# Patient Record
Sex: Male | Born: 1995 | Hispanic: Yes | Marital: Single | Smoking: Never smoker
Health system: Southern US, Community
[De-identification: ages and names within clinical notes are randomized; demographics above are authoritative.]

---
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
## 2018-09-24 ENCOUNTER — Telehealth: Payer: Self-pay | Admitting: *Deleted

## 2018-09-24 DIAGNOSIS — Z20822 Contact with and (suspected) exposure to covid-19: Secondary | ICD-10-CM

## 2018-09-24 NOTE — Telephone Encounter (Signed)
Pt called again and appt scheduled fro 09/24/18 at 9:30 am. Pt notified testing would be at [facility] at [facility] [address]. Pt advised to stay in car when he arrives to testing site and to wear a mask. Pt notified that he would be contacted within 48-72 hours with results. Pt verbalized understanding.

## 2018-09-24 NOTE — Telephone Encounter (Signed)
Received call from Dr. [name] from the [facility]  to request COVID-19 testing for the pt. Pt can be contacted at [phone]. Per Dr. [name] pt does speak English.Attempted to call pt but no answer at this time. Left message for pt to return call to [phone] to be scheduled for appointment.

## 2018-09-25 ENCOUNTER — Other Ambulatory Visit: Payer: Self-pay

## 2018-09-25 DIAGNOSIS — Z20822 Contact with and (suspected) exposure to covid-19: Secondary | ICD-10-CM

## 2018-09-25 LAB — NOVEL CORONAVIRUS, NAA: SARS-CoV-2, NAA: DETECTED

## 2018-09-28 ENCOUNTER — Telehealth (HOSPITAL_COMMUNITY): Payer: Self-pay | Admitting: Internal Medicine

## 2018-09-28 LAB — NOVEL CORONAVIRUS, NAA: SARS-CoV-2, NAA: DETECTED — AB

## 2018-09-28 NOTE — Telephone Encounter (Signed)
Notified by [facility] of positive Covid-19 test result from testing 5/14.  Spoke with patient by phone to notify him of result.  Onset of symptoms was 5/12, with headache, light headedness. Fever on 5/13, just the one evening.  Feels better now, some residual cough with phlegm.  Advised to stay home for at least 10 days from symptom onset, with no fever x 3 days in a row and all respiratory symptoms improving.  The health department will also be in touch.  LM

## 2018-09-29 ENCOUNTER — Encounter: Payer: Self-pay | Admitting: Family Medicine

## 2018-09-29 DIAGNOSIS — U071 COVID-19: Secondary | ICD-10-CM | POA: Insufficient documentation

## 2018-09-29 NOTE — Telephone Encounter (Signed)
Dr. Hillery Aldo called to ask if the COVID test results were back on this patient, I advised of the note below from Dr. Dayton Scrape, she verbalized understanding and says she thought she would receive the results since she requested the test. I advised of the workflow to enter Dr. Dayton Scrape as the ordering physician and that's who the results will ultimately go to, she understood.

## 2019-01-21 ENCOUNTER — Emergency Department
Admission: EM | Admit: 2019-01-21 | Discharge: 2019-01-22 | Disposition: A | Discharge status: Home / Self Care | Payer: Self-pay | Attending: Emergency Medicine | Admitting: Emergency Medicine

## 2019-01-21 ENCOUNTER — Other Ambulatory Visit: Payer: Self-pay

## 2019-01-21 ENCOUNTER — Encounter: Payer: Self-pay | Admitting: Emergency Medicine

## 2019-01-21 DIAGNOSIS — L509 Urticaria, unspecified: Secondary | ICD-10-CM | POA: Insufficient documentation

## 2019-01-21 MED ORDER — DIPHENHYDRAMINE HCL 25 MG PO TABS: 25.0000 mg | ORAL_TABLET | Freq: Four times a day (QID) | ORAL | 0 refills | Status: DC | PRN

## 2019-01-21 MED ORDER — DIPHENHYDRAMINE HCL 25 MG PO CAPS
25.0000 mg | ORAL_CAPSULE | Freq: Once | ORAL | Status: AC
  Administered 2019-01-22: 25 mg via ORAL
  Filled 2019-01-21: qty 1

## 2019-01-21 MED ORDER — FAMOTIDINE 20 MG PO TABS
40.0000 mg | ORAL_TABLET | Freq: Once | ORAL | Status: AC
  Administered 2019-01-22: 40 mg via ORAL
  Filled 2019-01-21: qty 2

## 2019-01-21 MED ORDER — PREDNISONE 20 MG PO TABS
60.0000 mg | ORAL_TABLET | Freq: Once | ORAL | Status: AC
  Administered 2019-01-22: 01:00:00 60 mg via ORAL
  Filled 2019-01-21: qty 3

## 2019-01-21 MED ORDER — FAMOTIDINE 20 MG PO TABS: 20.0000 mg | ORAL_TABLET | Freq: Two times a day (BID) | ORAL | 0 refills | Status: AC

## 2019-01-21 NOTE — ED Provider Notes (Signed)
[facility] Emergency Department Provider Note  ____________________________________________  Time seen: Approximately 11:54 PM  I have reviewed the triage vital signs and the nursing notes.   HISTORY  Chief Complaint Rash    HPI Jared Harris is a 23 y.o. male presents to the emergency department with hives of the right upper extremity after patient ate dinner.  Hives have improved while waiting in the emergency department.  Patient denies shortness of breath, chest tightness or dysphasia.  He denies history of anaphylaxis.  No diarrhea or emesis.  No medications were attempted prior to coming into the emergency department.         History reviewed. No pertinent past medical history.  Patient Active Problem List   Diagnosis Date Noted  . COVID-19 virus infection 09/29/2018    History reviewed. No pertinent surgical history.  Prior to Admission medications   Medication Sig Start Date End Date Taking? Authorizing Provider  diphenhydrAMINE (BENADRYL ALLERGY) 25 MG tablet Take 1 tablet (25 mg total) by mouth every 6 (six) hours as needed for up to 5 days. 01/21/19 01/26/19  Lannie Fields, PA-C  famotidine (PEPCID) 20 MG tablet Take 1 tablet (20 mg total) by mouth 2 (two) times daily for 5 days. 01/21/19 01/26/19  Lannie Fields, PA-C    Allergies Patient has no known allergies.  No family history on file.  Social History Social History   Tobacco Use  . Smoking status: Never Smoker  . Smokeless tobacco: Never Used  Substance Use Topics  . Alcohol use: Not Currently  . Drug use: Not Currently     Review of Systems  Constitutional: No fever/chills Eyes: No visual changes. No discharge ENT: No upper respiratory complaints. Cardiovascular: no chest pain. Respiratory: no cough. No SOB. Gastrointestinal: No abdominal pain.  No nausea, no vomiting.  No diarrhea.  No constipation. Musculoskeletal: Negative for musculoskeletal pain. Skin:  Patient has urticaria.  Neurological: Negative for headaches, focal weakness or numbness.   ____________________________________________   PHYSICAL EXAM:  VITAL SIGNS: ED Triage Vitals  Enc Vitals Group     BP 01/21/19 2216 133/79     Pulse Rate 01/21/19 2216 84     Resp 01/21/19 2216 16     Temp 01/21/19 2216 98 F (36.7 C)     Temp Source 01/21/19 2216 Oral     SpO2 01/21/19 2216 98 %     Weight 01/21/19 2214 188 lb (85.3 kg)     Height 01/21/19 2214 5\' 8"  (1.727 m)     Head Circumference --      Peak Flow --      Pain Score 01/21/19 2214 0     Pain Loc --      Pain Edu? --      Excl. in [place]? --      Constitutional: Alert and oriented. Well appearing and in no acute distress. Eyes: Conjunctivae are normal. PERRL. EOMI. Head: Atraumatic.  Cardiovascular: Normal rate, regular rhythm. Normal S1 and S2.  Good peripheral circulation. Respiratory: Normal respiratory effort without tachypnea or retractions. Lungs CTAB. Good air entry to the bases with no decreased or absent breath sounds. Gastrointestinal: Bowel sounds 4 quadrants. Soft and nontender to palpation. No guarding or rigidity. No palpable masses. No distention. No CVA tenderness. Musculoskeletal: Full range of motion to all extremities. No gross deformities appreciated. Neurologic:  Normal speech and language. No gross focal neurologic deficits are appreciated.  Skin: Patient has residual urticaria of the right  upper extremity. Psychiatric: Mood and affect are normal. Speech and behavior are normal. Patient exhibits appropriate insight and judgement.   ____________________________________________   LABS (all labs ordered are listed, but only abnormal results are displayed)  Labs Reviewed - No data to display ____________________________________________  EKG   ____________________________________________  RADIOLOGY   No results  found.  ____________________________________________    PROCEDURES  Procedure(s) performed:    Procedures    Medications  predniSONE (DELTASONE) tablet 60 mg (has no administration in time range)  diphenhydrAMINE (BENADRYL) capsule 25 mg (has no administration in time range)  famotidine (PEPCID) tablet 40 mg (has no administration in time range)     ____________________________________________   INITIAL IMPRESSION / ASSESSMENT AND PLAN / ED COURSE  Pertinent labs & imaging results that were available during my care of the patient were reviewed by me and considered in my medical decision making (see chart for details).  Review of the [place] CSRS was performed in accordance of the [facility] prior to dispensing any controlled drugs.           Assessment and Plan:  Urticaria 23 year old male presents to the emergency department with urticaria of the right upper extremity that has improved significantly while waiting in the emergency department.  Patient was given prednisone, famotidine and Benadryl in the ED.  He was discharged with famotidine and Benadryl and advised to take aforementioned medications until hives completely resolved.  Vital signs are reassuring prior to discharge.  All patient questions were answered.     ____________________________________________  FINAL CLINICAL IMPRESSION(S) / ED DIAGNOSES  Final diagnoses:  Urticaria      NEW MEDICATIONS STARTED DURING THIS VISIT:  ED Discharge Orders         Ordered    diphenhydrAMINE (BENADRYL ALLERGY) 25 MG tablet  Every 6 hours PRN     01/21/19 2344    famotidine (PEPCID) 20 MG tablet  2 times daily     01/21/19 2344              This chart was dictated using voice recognition software/Dragon. Despite best efforts to proofread, errors can occur which can change the meaning. Any change was purely unintentional.    Orvil FeilWoods, Tariya Morrissette M, PA-C 01/22/19 0001    Concha SeFunke, Mary E, MD 01/26/19 1157

## 2019-01-21 NOTE — ED Triage Notes (Signed)
Pt c/o rash and itching to bilat upper extremeties . Pt has noted redness on forearms. Denies any known allergies. States started about 71min ago. Denies SOB or oral swelling. VSS

## 2019-01-21 NOTE — Discharge Instructions (Addendum)
Take Benadryl every 8 hours until hives resolve. Take famotidine twice daily until hives resolve.

## 2020-09-30 ENCOUNTER — Emergency Department
Admission: EM | Admit: 2020-09-30 | Discharge: 2020-09-30 | Disposition: A | Discharge status: Home / Self Care | Payer: Self-pay | Attending: Emergency Medicine | Admitting: Emergency Medicine

## 2020-09-30 ENCOUNTER — Encounter: Payer: Self-pay | Admitting: Emergency Medicine

## 2020-09-30 ENCOUNTER — Other Ambulatory Visit: Payer: Self-pay

## 2020-09-30 DIAGNOSIS — Z8616 Personal history of COVID-19: Secondary | ICD-10-CM | POA: Insufficient documentation

## 2020-09-30 DIAGNOSIS — K219 Gastro-esophageal reflux disease without esophagitis: Secondary | ICD-10-CM

## 2020-09-30 DIAGNOSIS — R197 Diarrhea, unspecified: Secondary | ICD-10-CM

## 2020-09-30 LAB — URINALYSIS, COMPLETE (UACMP) WITH MICROSCOPIC
Bacteria, UA: NONE SEEN
Bilirubin Urine: NEGATIVE
Glucose, UA: NEGATIVE mg/dL
Hgb urine dipstick: NEGATIVE
Ketones, ur: NEGATIVE mg/dL
Leukocytes,Ua: NEGATIVE
Nitrite: NEGATIVE
Protein, ur: NEGATIVE mg/dL
Specific Gravity, Urine: 1.001 — ABNORMAL LOW (ref 1.005–1.030)
Squamous Epithelial / HPF: NONE SEEN (ref 0–5)
WBC, UA: NONE SEEN WBC/hpf (ref 0–5)
pH: 8 (ref 5.0–8.0)

## 2020-09-30 LAB — COMPREHENSIVE METABOLIC PANEL
ALT: 16 U/L (ref 0–44)
AST: 15 U/L (ref 15–41)
Albumin: 4.6 g/dL (ref 3.5–5.0)
Alkaline Phosphatase: 62 U/L (ref 38–126)
Anion gap: 8 (ref 5–15)
BUN: 9 mg/dL (ref 6–20)
CO2: 25 mmol/L (ref 22–32)
Calcium: 9.1 mg/dL (ref 8.9–10.3)
Chloride: 102 mmol/L (ref 98–111)
Creatinine, Ser: 1.02 mg/dL (ref 0.61–1.24)
GFR, Estimated: >60 mL/min (ref >60)
Glucose, Bld: 113 mg/dL — ABNORMAL HIGH (ref 70–99)
Potassium: 3.5 mmol/L (ref 3.5–5.1)
Sodium: 135 mmol/L (ref 135–145)
Total Bilirubin: 1.5 mg/dL — ABNORMAL HIGH (ref 0.3–1.2)
Total Protein: 7.9 g/dL (ref 6.5–8.1)

## 2020-09-30 LAB — CBC
HCT: 40.9 % (ref 39.0–52.0)
Hemoglobin: 14.7 g/dL (ref 13.0–17.0)
MCH: 30 pg (ref 26.0–34.0)
MCHC: 35.9 g/dL (ref 30.0–36.0)
MCV: 83.5 fL (ref 80.0–100.0)
Platelets: 216 10*3/uL (ref 150–400)
RBC: 4.9 MIL/uL (ref 4.22–5.81)
RDW: 11.5 % (ref 11.5–15.5)
WBC: 6.1 10*3/uL (ref 4.0–10.5)
nRBC: 0 % (ref 0.0–0.2)

## 2020-09-30 LAB — LIPASE, BLOOD: Lipase: 28 U/L (ref 11–51)

## 2020-09-30 MED ORDER — OMEPRAZOLE MAGNESIUM 20 MG PO TBEC: 20.0000 mg | DELAYED_RELEASE_TABLET | Freq: Every day | ORAL | 1 refills | Status: AC

## 2020-09-30 NOTE — ED Triage Notes (Signed)
Pt presents via acems with c/o abdominal pain that began Monday. Pt states currently he is in no pain. Abdominal pain is only present when patient eats. Pt reports last BM today. Pt reports nausea and diarrhea associated with abdominal pain.

## 2020-09-30 NOTE — Discharge Instructions (Addendum)
Please take medication as prescribed.  Make sure you are staying hydrated and drinking lots of fluids.  Follow-up with gastroenterologist if no improvement in symptoms in 1 week.  Return to the ER for any fevers, abdominal pain, worsening diarrhea, nausea, vomiting or any urgent changes in health

## 2020-09-30 NOTE — ED Provider Notes (Signed)
[facility] EMERGENCY DEPARTMENT Provider Note   CSN: [id number] Arrival date & time: 09/30/20  1446     History Chief Complaint  Patient presents with  . Abdominal Pain    Jared Harris is a 25 y.o. male presents to the emergency department for evaluation of 1 week of diarrhea.  Patient states 1 week ago he had diarrhea present every time he ate.  He denies any nausea, vomiting, abdominal pain or blood in stool.  Patient states over the week diarrhea did seem to improve and now he is only having diarrhea with eating fatty foods.  6 days ago he did have some abdominal pain that was sharp, short-lived only lasting after eating a few meals.  He is no longer having abdominal pain with eating.  He is tolerating p.o. well.  He only notes diarrhea with fatty foods.  He does admit to having reflux and burning in his throat after eating and lying down.  HPI     History reviewed. No pertinent past medical history.  Patient Active Problem List   Diagnosis Date Noted  . COVID-19 virus infection 09/29/2018    History reviewed. No pertinent surgical history.     History reviewed. No pertinent family history.  Social History   Tobacco Use  . Smoking status: Never Smoker  . Smokeless tobacco: Never Used  Substance Use Topics  . Alcohol use: Not Currently  . Drug use: Not Currently    Home Medications Prior to Admission medications   Medication Sig Start Date End Date Taking? Authorizing Provider  omeprazole (PRILOSEC OTC) 20 MG tablet Take 1 tablet (20 mg total) by mouth daily. 09/30/20 10/30/20 Yes Evon Slack, PA-C  diphenhydrAMINE (BENADRYL ALLERGY) 25 MG tablet Take 1 tablet (25 mg total) by mouth every 6 (six) hours as needed for up to 5 days. 01/21/19 01/26/19  Orvil Feil, PA-C  famotidine (PEPCID) 20 MG tablet Take 1 tablet (20 mg total) by mouth 2 (two) times daily for 5 days. 01/21/19 01/26/19  Orvil Feil, PA-C    Allergies    Patient  has no known allergies.  Review of Systems   Review of Systems  Constitutional: Negative for fatigue and fever.  HENT: Negative for sore throat and trouble swallowing.   Eyes: Negative for pain.  Respiratory: Negative for shortness of breath.   Cardiovascular: Negative for chest pain.  Gastrointestinal: Positive for diarrhea. Negative for abdominal distention, abdominal pain, blood in stool, nausea and vomiting.  Genitourinary: Negative for dysuria and hematuria.  Musculoskeletal: Negative for arthralgias and back pain.  Skin: Negative for rash and wound.  Neurological: Negative for dizziness, light-headedness and headaches.    Physical Exam Updated Vital Signs BP (!) 143/96   Pulse 77   Temp 98 F (36.7 C) (Oral)   Resp 16   Ht 5\' 8"  (1.727 m)   Wt 95.3 kg   SpO2 99%   BMI 31.93 kg/m   Physical Exam Constitutional:      Appearance: He is well-developed.  HENT:     Head: Normocephalic and atraumatic.     Right Ear: Ear canal and external ear normal.     Left Ear: Ear canal and external ear normal.     Mouth/Throat:     Pharynx: No oropharyngeal exudate or posterior oropharyngeal erythema.  Eyes:     Extraocular Movements: Extraocular movements intact.     Conjunctiva/sclera: Conjunctivae normal.     Pupils: Pupils are equal, round,  and reactive to light.  Cardiovascular:     Rate and Rhythm: Normal rate.  Pulmonary:     Effort: Pulmonary effort is normal. No respiratory distress.  Abdominal:     General: Abdomen is flat. Bowel sounds are normal. There is no distension.     Palpations: There is no mass.     Tenderness: There is no abdominal tenderness. There is no guarding.     Hernia: No hernia is present.  Musculoskeletal:        General: Normal range of motion.     Cervical back: Normal range of motion.  Skin:    General: Skin is warm.     Findings: No rash.  Neurological:     General: No focal deficit present.     Mental Status: He is alert and oriented  to person, place, and time.  Psychiatric:        Behavior: Behavior normal.        Thought Content: Thought content normal.     ED Results / Procedures / Treatments   Labs (all labs ordered are listed, but only abnormal results are displayed) Labs Reviewed  COMPREHENSIVE METABOLIC PANEL - Abnormal; Notable for the following components:      Result Value   Glucose, Bld 113 (*)    Total Bilirubin 1.5 (*)    All other components within normal limits  URINALYSIS, COMPLETE (UACMP) WITH MICROSCOPIC - Abnormal; Notable for the following components:   Color, Urine COLORLESS (*)    APPearance CLEAR (*)    Specific Gravity, Urine 1.001 (*)    All other components within normal limits  LIPASE, BLOOD  CBC    EKG None  Radiology No results found.  Procedures Procedures   Medications Ordered in ED Medications - No data to display  ED Course  I have reviewed the triage vital signs and the nursing notes.  Pertinent labs & imaging results that were available during my care of the patient were reviewed by me and considered in my medical decision making (see chart for details).    MDM Rules/Calculators/A&P                          25 year old male with diarrhea x1 week.  Diarrhea has improved, now only with fatty foods.  He does report history of reflux.  Vital signs are stable, afebrile.  Only had abdominal pain 6 days ago, after eating meals.  This has resolved.  Patient with symptoms of gastroenteritis and GERD.  No sign of any infectious processes.  CBC and CMP are normal.  Normal UA.  Vital signs stable, afebrile nontachycardic.  Patient PHYSICAL exam is normal, no tenderness on exam.  Will have patient is started on omeprazole.  He will avoid fatty foods.  He is given information on foods to avoid and if no improvement in the next week will follow-up with gastroenterologist.  He understands signs symptoms return to the ER for such as any abdominal pain, fevers, vomiting, worsening  diarrhea or any urgent changes in his health. Final Clinical Impression(s) / ED Diagnoses Final diagnoses:  Diarrhea, unspecified type  Chronic GERD    Rx / DC Orders ED Discharge Orders         Ordered    omeprazole (PRILOSEC OTC) 20 MG tablet  Daily        09/30/20 1619           Evon Slack, [place] 09/30/20 1631  Loleta Rose, MD 10/07/20 [phone]

## 2021-02-27 ENCOUNTER — Encounter: Payer: Self-pay | Admitting: Gastroenterology

## 2021-02-27 ENCOUNTER — Ambulatory Visit: Payer: Medicaid Other | Admitting: Gastroenterology

## 2021-12-10 ENCOUNTER — Other Ambulatory Visit: Payer: Self-pay

## 2021-12-10 ENCOUNTER — Ambulatory Visit: Payer: Medicaid Other | Admitting: Gastroenterology
